# Patient Record
Sex: Female | Born: 1955 | Race: White | Hispanic: No | Marital: Married | Smoking: Never smoker
Health system: Southern US, Community
[De-identification: ages and names within clinical notes are randomized; demographics above are authoritative.]

## PROBLEM LIST (undated history)

## (undated) DIAGNOSIS — E876 Hypokalemia: Secondary | ICD-10-CM

## (undated) DIAGNOSIS — G4700 Insomnia, unspecified: Secondary | ICD-10-CM

## (undated) DIAGNOSIS — I10 Essential (primary) hypertension: Secondary | ICD-10-CM

## (undated) DIAGNOSIS — E559 Vitamin D deficiency, unspecified: Secondary | ICD-10-CM

## (undated) DIAGNOSIS — B009 Herpesviral infection, unspecified: Secondary | ICD-10-CM

## (undated) HISTORY — DX: Hypokalemia: E87.6

## (undated) HISTORY — DX: Essential (primary) hypertension: I10

## (undated) HISTORY — DX: Vitamin D deficiency, unspecified: E55.9

## (undated) HISTORY — DX: Insomnia, unspecified: G47.00

## (undated) HISTORY — DX: Herpesviral infection, unspecified: B00.9

---

## 1997-11-20 ENCOUNTER — Other Ambulatory Visit: Admission: RE | Admit: 1997-11-20 | Discharge: 1997-11-20 | Payer: Self-pay | Admitting: Obstetrics and Gynecology

## 1997-12-11 ENCOUNTER — Ambulatory Visit (HOSPITAL_COMMUNITY): Admission: RE | Admit: 1997-12-11 | Discharge: 1997-12-11 | Payer: Self-pay | Admitting: Obstetrics and Gynecology

## 1997-12-11 ENCOUNTER — Encounter: Payer: Self-pay | Admitting: Obstetrics and Gynecology

## 1999-04-02 ENCOUNTER — Encounter: Payer: Self-pay | Admitting: Obstetrics and Gynecology

## 1999-04-02 ENCOUNTER — Ambulatory Visit (HOSPITAL_COMMUNITY): Admission: RE | Admit: 1999-04-02 | Discharge: 1999-04-02 | Payer: Self-pay | Admitting: Obstetrics and Gynecology

## 1999-04-07 ENCOUNTER — Other Ambulatory Visit: Admission: RE | Admit: 1999-04-07 | Discharge: 1999-04-07 | Payer: Self-pay | Admitting: Obstetrics & Gynecology

## 2002-04-27 ENCOUNTER — Other Ambulatory Visit: Admission: RE | Admit: 2002-04-27 | Discharge: 2002-04-27 | Payer: Self-pay | Admitting: Obstetrics and Gynecology

## 2002-05-02 ENCOUNTER — Ambulatory Visit (HOSPITAL_COMMUNITY): Admission: RE | Admit: 2002-05-02 | Discharge: 2002-05-02 | Payer: Self-pay | Admitting: Obstetrics and Gynecology

## 2002-05-02 ENCOUNTER — Encounter: Payer: Self-pay | Admitting: Obstetrics and Gynecology

## 2003-05-14 ENCOUNTER — Inpatient Hospital Stay (HOSPITAL_COMMUNITY): Admission: EM | Admit: 2003-05-14 | Discharge: 2003-05-15 | Payer: Self-pay | Admitting: Emergency Medicine

## 2003-11-06 ENCOUNTER — Other Ambulatory Visit: Admission: RE | Admit: 2003-11-06 | Discharge: 2003-11-06 | Payer: Self-pay | Admitting: Obstetrics and Gynecology

## 2003-12-27 ENCOUNTER — Encounter: Admission: RE | Admit: 2003-12-27 | Discharge: 2003-12-27 | Payer: Self-pay | Admitting: Obstetrics and Gynecology

## 2005-08-26 ENCOUNTER — Other Ambulatory Visit: Admission: RE | Admit: 2005-08-26 | Discharge: 2005-08-26 | Payer: Self-pay | Admitting: Family Medicine

## 2005-09-06 ENCOUNTER — Encounter: Admission: RE | Admit: 2005-09-06 | Discharge: 2005-09-06 | Payer: Self-pay | Admitting: Family Medicine

## 2005-12-21 ENCOUNTER — Ambulatory Visit (HOSPITAL_COMMUNITY): Admission: RE | Admit: 2005-12-21 | Discharge: 2005-12-21 | Payer: Self-pay | Admitting: Obstetrics and Gynecology

## 2006-12-16 ENCOUNTER — Other Ambulatory Visit: Admission: RE | Admit: 2006-12-16 | Discharge: 2006-12-16 | Payer: Self-pay | Admitting: Obstetrics and Gynecology

## 2007-03-28 ENCOUNTER — Ambulatory Visit (HOSPITAL_COMMUNITY): Admission: RE | Admit: 2007-03-28 | Discharge: 2007-03-28 | Payer: Self-pay | Admitting: Obstetrics and Gynecology

## 2007-12-26 ENCOUNTER — Other Ambulatory Visit: Admission: RE | Admit: 2007-12-26 | Discharge: 2007-12-26 | Payer: Self-pay | Admitting: Obstetrics and Gynecology

## 2008-04-03 ENCOUNTER — Ambulatory Visit (HOSPITAL_COMMUNITY): Admission: RE | Admit: 2008-04-03 | Discharge: 2008-04-03 | Payer: Self-pay | Admitting: Obstetrics and Gynecology

## 2009-11-19 ENCOUNTER — Ambulatory Visit (HOSPITAL_COMMUNITY): Admission: RE | Admit: 2009-11-19 | Discharge: 2009-11-19 | Payer: Self-pay | Admitting: Obstetrics and Gynecology

## 2011-11-03 ENCOUNTER — Other Ambulatory Visit: Payer: Self-pay | Admitting: Obstetrics and Gynecology

## 2011-11-03 DIAGNOSIS — Z1231 Encounter for screening mammogram for malignant neoplasm of breast: Secondary | ICD-10-CM

## 2011-11-23 ENCOUNTER — Ambulatory Visit (HOSPITAL_COMMUNITY)
Admission: RE | Admit: 2011-11-23 | Discharge: 2011-11-23 | Disposition: A | Discharge status: Home / Self Care | Payer: Managed Care, Other (non HMO) | Source: Ambulatory Visit | Attending: Obstetrics and Gynecology | Admitting: Obstetrics and Gynecology

## 2011-11-23 DIAGNOSIS — Z1231 Encounter for screening mammogram for malignant neoplasm of breast: Secondary | ICD-10-CM | POA: Insufficient documentation

## 2012-01-03 ENCOUNTER — Other Ambulatory Visit (HOSPITAL_COMMUNITY): Payer: Self-pay | Admitting: Family Medicine

## 2012-01-03 DIAGNOSIS — E0590 Thyrotoxicosis, unspecified without thyrotoxic crisis or storm: Secondary | ICD-10-CM

## 2012-01-26 ENCOUNTER — Ambulatory Visit (HOSPITAL_COMMUNITY): Payer: Managed Care, Other (non HMO)

## 2012-01-27 ENCOUNTER — Encounter (HOSPITAL_COMMUNITY): Payer: Managed Care, Other (non HMO)

## 2012-02-15 ENCOUNTER — Encounter (HOSPITAL_COMMUNITY)
Admission: RE | Admit: 2012-02-15 | Discharge: 2012-02-15 | Disposition: A | Discharge status: Home / Self Care | Payer: BC Managed Care – PPO | Source: Ambulatory Visit | Attending: Family Medicine | Admitting: Family Medicine

## 2012-02-15 DIAGNOSIS — E0590 Thyrotoxicosis, unspecified without thyrotoxic crisis or storm: Secondary | ICD-10-CM | POA: Insufficient documentation

## 2012-02-16 ENCOUNTER — Encounter (HOSPITAL_COMMUNITY)
Admission: RE | Admit: 2012-02-16 | Discharge: 2012-02-16 | Disposition: A | Discharge status: Home / Self Care | Payer: BC Managed Care – PPO | Source: Ambulatory Visit | Attending: Family Medicine | Admitting: Family Medicine

## 2012-02-16 MED ORDER — SODIUM PERTECHNETATE TC 99M INJECTION
10.5000 | Freq: Once | INTRAVENOUS | Status: AC | PRN
  Administered 2012-02-16: 11 via INTRAVENOUS

## 2012-02-16 MED ORDER — SODIUM IODIDE I 131 CAPSULE
4.0000 | Freq: Once | INTRAVENOUS | Status: AC | PRN
  Administered 2012-02-16: 4 via ORAL

## 2012-11-28 ENCOUNTER — Ambulatory Visit: Payer: Self-pay | Admitting: Obstetrics and Gynecology

## 2012-11-28 ENCOUNTER — Ambulatory Visit: Payer: Self-pay | Admitting: Gynecology

## 2013-11-26 ENCOUNTER — Other Ambulatory Visit: Payer: Self-pay | Admitting: Family Medicine

## 2013-11-26 ENCOUNTER — Ambulatory Visit
Admission: RE | Admit: 2013-11-26 | Discharge: 2013-11-26 | Disposition: A | Discharge status: Home / Self Care | Payer: BC Managed Care – PPO | Source: Ambulatory Visit | Attending: Family Medicine | Admitting: Family Medicine

## 2013-11-26 DIAGNOSIS — R1032 Left lower quadrant pain: Secondary | ICD-10-CM

## 2014-05-16 ENCOUNTER — Other Ambulatory Visit: Payer: Self-pay | Admitting: Family Medicine

## 2014-05-16 ENCOUNTER — Other Ambulatory Visit (HOSPITAL_COMMUNITY)
Admission: RE | Admit: 2014-05-16 | Discharge: 2014-05-16 | Disposition: A | Discharge status: Home / Self Care | Payer: BC Managed Care – PPO | Source: Ambulatory Visit | Attending: Family Medicine | Admitting: Family Medicine

## 2014-05-16 DIAGNOSIS — Z01419 Encounter for gynecological examination (general) (routine) without abnormal findings: Secondary | ICD-10-CM | POA: Insufficient documentation

## 2014-05-20 LAB — CYTOLOGY - PAP

## 2014-05-21 ENCOUNTER — Other Ambulatory Visit (HOSPITAL_COMMUNITY): Payer: Self-pay | Admitting: Family Medicine

## 2014-05-21 DIAGNOSIS — Z1231 Encounter for screening mammogram for malignant neoplasm of breast: Secondary | ICD-10-CM

## 2014-06-05 ENCOUNTER — Ambulatory Visit (HOSPITAL_COMMUNITY): Payer: BC Managed Care – PPO

## 2014-06-14 ENCOUNTER — Ambulatory Visit (HOSPITAL_COMMUNITY): Payer: BC Managed Care – PPO

## 2014-06-18 ENCOUNTER — Ambulatory Visit (HOSPITAL_COMMUNITY)
Admission: RE | Admit: 2014-06-18 | Discharge: 2014-06-18 | Disposition: A | Discharge status: Home / Self Care | Payer: BC Managed Care – PPO | Source: Ambulatory Visit | Attending: Family Medicine | Admitting: Family Medicine

## 2014-06-18 DIAGNOSIS — Z1231 Encounter for screening mammogram for malignant neoplasm of breast: Secondary | ICD-10-CM | POA: Diagnosis not present

## 2017-01-12 ENCOUNTER — Other Ambulatory Visit: Payer: Self-pay | Admitting: Family Medicine

## 2017-01-12 DIAGNOSIS — Z1231 Encounter for screening mammogram for malignant neoplasm of breast: Secondary | ICD-10-CM

## 2017-02-14 ENCOUNTER — Ambulatory Visit
Admission: RE | Admit: 2017-02-14 | Discharge: 2017-02-14 | Disposition: A | Discharge status: Home / Self Care | Payer: BC Managed Care – PPO | Source: Ambulatory Visit | Attending: Family Medicine | Admitting: Family Medicine

## 2017-02-14 DIAGNOSIS — Z1231 Encounter for screening mammogram for malignant neoplasm of breast: Secondary | ICD-10-CM

## 2018-09-26 ENCOUNTER — Other Ambulatory Visit: Payer: Self-pay | Admitting: Gastroenterology

## 2018-09-26 DIAGNOSIS — R1310 Dysphagia, unspecified: Secondary | ICD-10-CM

## 2018-10-02 ENCOUNTER — Ambulatory Visit
Admission: RE | Admit: 2018-10-02 | Discharge: 2018-10-02 | Disposition: A | Discharge status: Home / Self Care | Payer: BC Managed Care – PPO | Source: Ambulatory Visit | Attending: Gastroenterology | Admitting: Gastroenterology

## 2018-10-02 DIAGNOSIS — R1310 Dysphagia, unspecified: Secondary | ICD-10-CM

## 2018-10-12 ENCOUNTER — Other Ambulatory Visit: Payer: Self-pay | Admitting: Family Medicine

## 2018-10-12 ENCOUNTER — Other Ambulatory Visit (HOSPITAL_COMMUNITY)
Admission: RE | Admit: 2018-10-12 | Discharge: 2018-10-12 | Disposition: A | Discharge status: Home / Self Care | Payer: BC Managed Care – PPO | Source: Ambulatory Visit | Attending: Family Medicine | Admitting: Family Medicine

## 2018-10-12 DIAGNOSIS — Z01419 Encounter for gynecological examination (general) (routine) without abnormal findings: Secondary | ICD-10-CM | POA: Insufficient documentation

## 2018-10-18 LAB — CYTOLOGY - PAP
Adequacy: ABSENT
Diagnosis: NEGATIVE

## 2020-02-27 ENCOUNTER — Other Ambulatory Visit: Payer: Self-pay | Admitting: Family Medicine

## 2020-02-27 DIAGNOSIS — Z1231 Encounter for screening mammogram for malignant neoplasm of breast: Secondary | ICD-10-CM

## 2020-03-10 ENCOUNTER — Ambulatory Visit
Admission: RE | Admit: 2020-03-10 | Discharge: 2020-03-10 | Disposition: A | Discharge status: Home / Self Care | Payer: BC Managed Care – PPO | Source: Ambulatory Visit | Attending: Family Medicine | Admitting: Family Medicine

## 2020-03-10 ENCOUNTER — Other Ambulatory Visit: Payer: Self-pay

## 2020-03-10 DIAGNOSIS — Z1231 Encounter for screening mammogram for malignant neoplasm of breast: Secondary | ICD-10-CM

## 2020-04-16 ENCOUNTER — Other Ambulatory Visit: Payer: Self-pay

## 2020-04-16 DIAGNOSIS — R519 Headache, unspecified: Secondary | ICD-10-CM

## 2020-05-13 ENCOUNTER — Other Ambulatory Visit: Payer: BC Managed Care – PPO

## 2020-05-27 ENCOUNTER — Other Ambulatory Visit: Payer: BC Managed Care – PPO

## 2020-12-29 ENCOUNTER — Other Ambulatory Visit: Payer: Self-pay

## 2020-12-29 DIAGNOSIS — R519 Headache, unspecified: Secondary | ICD-10-CM

## 2020-12-29 DIAGNOSIS — R200 Anesthesia of skin: Secondary | ICD-10-CM

## 2021-01-29 ENCOUNTER — Other Ambulatory Visit: Payer: BC Managed Care – PPO

## 2021-02-14 ENCOUNTER — Ambulatory Visit
Admission: RE | Admit: 2021-02-14 | Discharge: 2021-02-14 | Disposition: A | Discharge status: Home / Self Care | Payer: BC Managed Care – PPO | Source: Ambulatory Visit | Attending: Family Medicine | Admitting: Family Medicine

## 2021-02-14 ENCOUNTER — Other Ambulatory Visit: Payer: Self-pay

## 2021-02-14 DIAGNOSIS — R200 Anesthesia of skin: Secondary | ICD-10-CM

## 2021-02-14 MED ORDER — GADOBENATE DIMEGLUMINE 529 MG/ML IV SOLN
20.0000 mL | Freq: Once | INTRAVENOUS | Status: AC | PRN
  Administered 2021-02-14: 20 mL via INTRAVENOUS

## 2021-06-24 DIAGNOSIS — Z23 Encounter for immunization: Secondary | ICD-10-CM | POA: Diagnosis not present

## 2021-06-24 DIAGNOSIS — I10 Essential (primary) hypertension: Secondary | ICD-10-CM | POA: Diagnosis not present

## 2021-06-24 DIAGNOSIS — E782 Mixed hyperlipidemia: Secondary | ICD-10-CM | POA: Diagnosis not present

## 2021-06-24 DIAGNOSIS — E0500 Thyrotoxicosis with diffuse goiter without thyrotoxic crisis or storm: Secondary | ICD-10-CM | POA: Diagnosis not present

## 2021-06-24 DIAGNOSIS — M79644 Pain in right finger(s): Secondary | ICD-10-CM | POA: Diagnosis not present

## 2021-06-24 DIAGNOSIS — G4700 Insomnia, unspecified: Secondary | ICD-10-CM | POA: Diagnosis not present

## 2021-06-24 DIAGNOSIS — R1310 Dysphagia, unspecified: Secondary | ICD-10-CM | POA: Diagnosis not present

## 2021-06-24 DIAGNOSIS — B001 Herpesviral vesicular dermatitis: Secondary | ICD-10-CM | POA: Diagnosis not present

## 2021-06-24 DIAGNOSIS — E669 Obesity, unspecified: Secondary | ICD-10-CM | POA: Diagnosis not present

## 2021-09-09 DIAGNOSIS — J3489 Other specified disorders of nose and nasal sinuses: Secondary | ICD-10-CM | POA: Diagnosis not present

## 2021-09-09 DIAGNOSIS — M25511 Pain in right shoulder: Secondary | ICD-10-CM | POA: Diagnosis not present

## 2021-09-09 DIAGNOSIS — R059 Cough, unspecified: Secondary | ICD-10-CM | POA: Diagnosis not present

## 2022-01-06 ENCOUNTER — Other Ambulatory Visit: Payer: Self-pay | Admitting: Family Medicine

## 2022-01-06 DIAGNOSIS — Z13820 Encounter for screening for osteoporosis: Secondary | ICD-10-CM

## 2022-01-06 DIAGNOSIS — Z1231 Encounter for screening mammogram for malignant neoplasm of breast: Secondary | ICD-10-CM

## 2022-01-06 DIAGNOSIS — Z0000 Encounter for general adult medical examination without abnormal findings: Secondary | ICD-10-CM | POA: Diagnosis not present

## 2022-01-06 DIAGNOSIS — E0500 Thyrotoxicosis with diffuse goiter without thyrotoxic crisis or storm: Secondary | ICD-10-CM | POA: Diagnosis not present

## 2022-01-06 DIAGNOSIS — G4700 Insomnia, unspecified: Secondary | ICD-10-CM | POA: Diagnosis not present

## 2022-01-06 DIAGNOSIS — Z23 Encounter for immunization: Secondary | ICD-10-CM | POA: Diagnosis not present

## 2022-01-06 DIAGNOSIS — K219 Gastro-esophageal reflux disease without esophagitis: Secondary | ICD-10-CM | POA: Diagnosis not present

## 2022-01-06 DIAGNOSIS — I10 Essential (primary) hypertension: Secondary | ICD-10-CM | POA: Diagnosis not present

## 2022-01-06 DIAGNOSIS — E782 Mixed hyperlipidemia: Secondary | ICD-10-CM | POA: Diagnosis not present

## 2022-01-06 DIAGNOSIS — K429 Umbilical hernia without obstruction or gangrene: Secondary | ICD-10-CM | POA: Diagnosis not present

## 2022-01-11 DIAGNOSIS — M25511 Pain in right shoulder: Secondary | ICD-10-CM | POA: Diagnosis not present

## 2022-01-19 DIAGNOSIS — M25511 Pain in right shoulder: Secondary | ICD-10-CM | POA: Diagnosis not present

## 2022-02-17 DIAGNOSIS — R945 Abnormal results of liver function studies: Secondary | ICD-10-CM | POA: Diagnosis not present

## 2022-02-19 ENCOUNTER — Other Ambulatory Visit: Payer: Self-pay | Admitting: Family Medicine

## 2022-02-19 DIAGNOSIS — R7989 Other specified abnormal findings of blood chemistry: Secondary | ICD-10-CM | POA: Diagnosis not present

## 2022-02-19 DIAGNOSIS — R945 Abnormal results of liver function studies: Secondary | ICD-10-CM | POA: Diagnosis not present

## 2022-03-04 ENCOUNTER — Ambulatory Visit
Admission: RE | Admit: 2022-03-04 | Discharge: 2022-03-04 | Disposition: A | Discharge status: Home / Self Care | Payer: Medicare PPO | Source: Ambulatory Visit | Attending: Family Medicine | Admitting: Family Medicine

## 2022-03-04 DIAGNOSIS — Z1231 Encounter for screening mammogram for malignant neoplasm of breast: Secondary | ICD-10-CM

## 2022-03-12 ENCOUNTER — Ambulatory Visit
Admission: RE | Admit: 2022-03-12 | Discharge: 2022-03-12 | Disposition: A | Discharge status: Home / Self Care | Payer: Medicare PPO | Source: Ambulatory Visit | Attending: Family Medicine | Admitting: Family Medicine

## 2022-03-12 DIAGNOSIS — R945 Abnormal results of liver function studies: Secondary | ICD-10-CM | POA: Diagnosis not present

## 2022-03-12 DIAGNOSIS — R7989 Other specified abnormal findings of blood chemistry: Secondary | ICD-10-CM

## 2022-03-17 DIAGNOSIS — R350 Frequency of micturition: Secondary | ICD-10-CM | POA: Diagnosis not present

## 2022-03-24 DIAGNOSIS — R7989 Other specified abnormal findings of blood chemistry: Secondary | ICD-10-CM | POA: Diagnosis not present

## 2022-03-24 DIAGNOSIS — D259 Leiomyoma of uterus, unspecified: Secondary | ICD-10-CM | POA: Diagnosis not present

## 2022-03-24 DIAGNOSIS — K760 Fatty (change of) liver, not elsewhere classified: Secondary | ICD-10-CM | POA: Diagnosis not present

## 2022-03-24 DIAGNOSIS — R945 Abnormal results of liver function studies: Secondary | ICD-10-CM | POA: Diagnosis not present

## 2022-03-24 DIAGNOSIS — N8110 Cystocele, unspecified: Secondary | ICD-10-CM | POA: Diagnosis not present

## 2022-03-24 DIAGNOSIS — R198 Other specified symptoms and signs involving the digestive system and abdomen: Secondary | ICD-10-CM | POA: Diagnosis not present

## 2022-06-23 ENCOUNTER — Ambulatory Visit
Admission: RE | Admit: 2022-06-23 | Discharge: 2022-06-23 | Disposition: A | Discharge status: Home / Self Care | Payer: Medicare PPO | Source: Ambulatory Visit | Attending: Family Medicine | Admitting: Family Medicine

## 2022-06-23 DIAGNOSIS — Z13820 Encounter for screening for osteoporosis: Secondary | ICD-10-CM

## 2022-06-23 DIAGNOSIS — E039 Hypothyroidism, unspecified: Secondary | ICD-10-CM | POA: Diagnosis not present

## 2022-06-23 DIAGNOSIS — E349 Endocrine disorder, unspecified: Secondary | ICD-10-CM | POA: Diagnosis not present

## 2022-06-23 DIAGNOSIS — M810 Age-related osteoporosis without current pathological fracture: Secondary | ICD-10-CM | POA: Diagnosis not present

## 2022-07-01 DIAGNOSIS — N398 Other specified disorders of urinary system: Secondary | ICD-10-CM | POA: Diagnosis not present

## 2022-07-01 DIAGNOSIS — N812 Incomplete uterovaginal prolapse: Secondary | ICD-10-CM | POA: Diagnosis not present

## 2022-07-01 DIAGNOSIS — R152 Fecal urgency: Secondary | ICD-10-CM | POA: Diagnosis not present

## 2022-07-01 DIAGNOSIS — R140 Abdominal distension (gaseous): Secondary | ICD-10-CM | POA: Diagnosis not present

## 2022-07-14 DIAGNOSIS — K760 Fatty (change of) liver, not elsewhere classified: Secondary | ICD-10-CM | POA: Diagnosis not present

## 2022-07-14 DIAGNOSIS — M8588 Other specified disorders of bone density and structure, other site: Secondary | ICD-10-CM | POA: Diagnosis not present

## 2022-07-14 DIAGNOSIS — G43909 Migraine, unspecified, not intractable, without status migrainosus: Secondary | ICD-10-CM | POA: Diagnosis not present

## 2022-07-14 DIAGNOSIS — R945 Abnormal results of liver function studies: Secondary | ICD-10-CM | POA: Diagnosis not present

## 2022-07-14 DIAGNOSIS — I10 Essential (primary) hypertension: Secondary | ICD-10-CM | POA: Diagnosis not present

## 2022-07-14 DIAGNOSIS — N8110 Cystocele, unspecified: Secondary | ICD-10-CM | POA: Diagnosis not present

## 2022-07-15 ENCOUNTER — Encounter: Payer: Self-pay | Admitting: Neurology

## 2022-08-05 NOTE — Progress Notes (Signed)
NEUROLOGY CONSULTATION NOTE  ALOUISE LEVENE MRN: [id number] DOB: 11-11-1955  Referring provider: Irven Coe, MD Primary care provider: L. Lupe Carney, MD  Reason for consult:  migraine  Assessment/Plan:   Migraine with aura, without status migrainosus, not intractable  Preventative not indicted When she gets a spell, she will try taking gabapentin 100mg  as needed. Follow up 6 months.   Subjective:  Lynn Hudson is a 67 year old female with HTN, HLD, GERD and fatty liver who presents for migraines.  History supplemented by referring provider's note.  She started having "episodes" in her 75s.  She develops sudden onset severe squeezing pain in her upper mid back that radiates down either arm and leg (usually left) and up the left side of her neck and into her left eye.  There is associated numbness, tingling and sensation that her limbs are like a balloon.  She states it feels like she is "having a heart attack".  She also has associated severe left sided pounding headache.  She also has nausea, photophobia and phonophobia.  Usually lasts 3-5 days, the most severe during the first day but then manageable.  Occurs infrequently, anywhere from once a month to every few months.  She has had 2 in the past 6 months.  During her last two episodes, she had new visual aura described as kaleidoscopic vision in both eyes lasting a couple of minutes, which was new.  She usually treats with Tylenol or ASA.     Workup included MRI of brain with and without contrast on 02/14/2021 which was personally reviewed and revealed mild chronic small vessel ischemic changes but no acute findings.    Her son has migraines   Current NSAIDS/analgesics:  Advil Current triptans:  none Current ergotamine:  none Current anti-emetic:  none Current muscle relaxants:  none Current Antihypertensive medications:  metoprolol succinate 25mg  daily, HCTZ Current Antidepressant medications:  none Current  Anticonvulsant medications:  none Current anti-CGRP:  none Current Antihistamines/Decongestants:  none Other therapy:  heating pad to leg (when affected) Other medications:  Ambien       PAST MEDICAL HISTORY: Past Medical History:  Diagnosis Date   HSV-1 (herpes simplex virus 1) infection    Hypertension    Insomnia    Potassium deficiency    Vitamin D deficiency     MEDICATIONS: Current Outpatient Medications on File Prior to Visit  Medication Sig Dispense Refill   esomeprazole (NEXIUM) 40 MG capsule Take 40 mg by mouth daily.     hydrochlorothiazide (HYDRODIURIL) 25 MG tablet Take 25 mg by mouth daily.     metoprolol succinate (TOPROL-XL) 25 MG 24 hr tablet Take 25 mg by mouth daily.     potassium chloride (KLOR-CON M) 10 MEQ tablet Take 10 mEq by mouth daily.     valACYclovir (VALTREX) 1000 MG tablet Take 1,000 mg by mouth as needed.     Vitamin D, Ergocalciferol, (DRISDOL) 1.25 MG (50000 UNIT) CAPS capsule Take 50,000 Units by mouth once a week.     zolpidem (AMBIEN) 10 MG tablet Take 10 mg by mouth at bedtime.     No current facility-administered medications on file prior to visit.     ALLERGIES: Not on File  FAMILY HISTORY: No family history on file.  Objective:  Blood pressure 102/60, height 5\' 6"  (1.676 m), weight 230 lb (104.3 kg). General: No acute distress.  Patient appears well-groomed.   Head:  Normocephalic/atraumatic Eyes:  fundi examined but not visualized Neck: supple,  no paraspinal tenderness, full range of motion Back: No paraspinal tenderness Heart: regular rate and rhythm Lungs: Clear to auscultation bilaterally. Vascular: No carotid bruits. Neurological Exam: Mental status: alert and oriented to person, place, and time, speech fluent and not dysarthric, language intact. Cranial nerves: CN I: not tested CN II: pupils equal, round and reactive to light, visual fields intact CN III, IV, VI:  full range of motion, no nystagmus, no ptosis CN  V: facial sensation intact. CN VII: upper and lower face symmetric CN VIII: hearing intact CN IX, X: gag intact, uvula midline CN XI: sternocleidomastoid and trapezius muscles intact CN XII: tongue midline Bulk & Tone: normal, no fasciculations. Motor:  muscle strength 5/5 throughout Sensation: Temperature sensation intact, vibratory sensation reduced in toes. Deep Tendon Reflexes:  1+ throughout,  toes downgoing.   Finger to nose testing:  Without dysmetria.   Heel to shin:  Without dysmetria.   Gait:  Normal station and stride.  Romberg negative.    Thank you for allowing me to take part in the care of this patient.  Shon Millet, DO  CC: Irven Coe, MD

## 2022-08-06 ENCOUNTER — Encounter: Payer: Self-pay | Admitting: Neurology

## 2022-08-06 ENCOUNTER — Ambulatory Visit: Payer: Medicare PPO | Admitting: Neurology

## 2022-08-06 VITALS — BP 102/60 | Ht 66.0 in | Wt 230.0 lb

## 2022-08-06 DIAGNOSIS — G43009 Migraine without aura, not intractable, without status migrainosus: Secondary | ICD-10-CM | POA: Diagnosis not present

## 2022-08-06 MED ORDER — GABAPENTIN 100 MG PO CAPS: 100.0000 mg | ORAL_CAPSULE | Freq: Three times a day (TID) | ORAL | 5 refills | Status: AC | PRN

## 2022-08-06 NOTE — Patient Instructions (Signed)
When you get an episode, take gabapentin 100mg  to treat the pain and headache.  May take up to 3 times in a day as needed.

## 2022-08-17 DIAGNOSIS — I10 Essential (primary) hypertension: Secondary | ICD-10-CM | POA: Diagnosis not present

## 2022-10-06 DIAGNOSIS — S46011A Strain of muscle(s) and tendon(s) of the rotator cuff of right shoulder, initial encounter: Secondary | ICD-10-CM | POA: Diagnosis not present

## 2022-10-27 DIAGNOSIS — E782 Mixed hyperlipidemia: Secondary | ICD-10-CM | POA: Diagnosis not present

## 2022-10-27 DIAGNOSIS — Z6839 Body mass index (BMI) 39.0-39.9, adult: Secondary | ICD-10-CM | POA: Diagnosis not present

## 2022-10-27 DIAGNOSIS — R0683 Snoring: Secondary | ICD-10-CM | POA: Diagnosis not present

## 2022-10-27 DIAGNOSIS — Z1331 Encounter for screening for depression: Secondary | ICD-10-CM | POA: Diagnosis not present

## 2022-10-27 DIAGNOSIS — K760 Fatty (change of) liver, not elsewhere classified: Secondary | ICD-10-CM | POA: Diagnosis not present

## 2022-10-27 DIAGNOSIS — I10 Essential (primary) hypertension: Secondary | ICD-10-CM | POA: Diagnosis not present

## 2022-11-09 DIAGNOSIS — S46011A Strain of muscle(s) and tendon(s) of the rotator cuff of right shoulder, initial encounter: Secondary | ICD-10-CM | POA: Diagnosis not present

## 2022-11-10 ENCOUNTER — Ambulatory Visit: Payer: Medicare PPO | Admitting: Neurology

## 2022-11-10 DIAGNOSIS — I10 Essential (primary) hypertension: Secondary | ICD-10-CM | POA: Diagnosis not present

## 2022-11-10 DIAGNOSIS — K760 Fatty (change of) liver, not elsewhere classified: Secondary | ICD-10-CM | POA: Diagnosis not present

## 2022-11-10 DIAGNOSIS — Z6838 Body mass index (BMI) 38.0-38.9, adult: Secondary | ICD-10-CM | POA: Diagnosis not present

## 2022-11-10 DIAGNOSIS — E782 Mixed hyperlipidemia: Secondary | ICD-10-CM | POA: Diagnosis not present

## 2022-11-17 DIAGNOSIS — M25511 Pain in right shoulder: Secondary | ICD-10-CM | POA: Diagnosis not present

## 2022-11-24 DIAGNOSIS — K760 Fatty (change of) liver, not elsewhere classified: Secondary | ICD-10-CM | POA: Diagnosis not present

## 2022-11-24 DIAGNOSIS — Z6837 Body mass index (BMI) 37.0-37.9, adult: Secondary | ICD-10-CM | POA: Diagnosis not present

## 2022-11-24 DIAGNOSIS — I10 Essential (primary) hypertension: Secondary | ICD-10-CM | POA: Diagnosis not present

## 2022-11-24 DIAGNOSIS — E782 Mixed hyperlipidemia: Secondary | ICD-10-CM | POA: Diagnosis not present

## 2022-12-01 DIAGNOSIS — S46011D Strain of muscle(s) and tendon(s) of the rotator cuff of right shoulder, subsequent encounter: Secondary | ICD-10-CM | POA: Diagnosis not present

## 2022-12-15 DIAGNOSIS — E782 Mixed hyperlipidemia: Secondary | ICD-10-CM | POA: Diagnosis not present

## 2022-12-15 DIAGNOSIS — I10 Essential (primary) hypertension: Secondary | ICD-10-CM | POA: Diagnosis not present

## 2022-12-15 DIAGNOSIS — Z6837 Body mass index (BMI) 37.0-37.9, adult: Secondary | ICD-10-CM | POA: Diagnosis not present

## 2022-12-15 DIAGNOSIS — K760 Fatty (change of) liver, not elsewhere classified: Secondary | ICD-10-CM | POA: Diagnosis not present

## 2023-01-11 DIAGNOSIS — Z6838 Body mass index (BMI) 38.0-38.9, adult: Secondary | ICD-10-CM | POA: Diagnosis not present

## 2023-01-11 DIAGNOSIS — E782 Mixed hyperlipidemia: Secondary | ICD-10-CM | POA: Diagnosis not present

## 2023-01-11 DIAGNOSIS — K760 Fatty (change of) liver, not elsewhere classified: Secondary | ICD-10-CM | POA: Diagnosis not present

## 2023-01-11 DIAGNOSIS — I10 Essential (primary) hypertension: Secondary | ICD-10-CM | POA: Diagnosis not present

## 2023-02-06 DIAGNOSIS — R058 Other specified cough: Secondary | ICD-10-CM | POA: Diagnosis not present

## 2023-02-06 DIAGNOSIS — J40 Bronchitis, not specified as acute or chronic: Secondary | ICD-10-CM | POA: Diagnosis not present

## 2023-02-06 DIAGNOSIS — R0981 Nasal congestion: Secondary | ICD-10-CM | POA: Diagnosis not present

## 2023-02-06 DIAGNOSIS — J329 Chronic sinusitis, unspecified: Secondary | ICD-10-CM | POA: Diagnosis not present

## 2023-02-08 ENCOUNTER — Ambulatory Visit: Payer: Medicare PPO | Admitting: Neurology

## 2023-02-21 DIAGNOSIS — S46011D Strain of muscle(s) and tendon(s) of the rotator cuff of right shoulder, subsequent encounter: Secondary | ICD-10-CM | POA: Diagnosis not present

## 2023-02-22 DIAGNOSIS — K760 Fatty (change of) liver, not elsewhere classified: Secondary | ICD-10-CM | POA: Diagnosis not present

## 2023-02-22 DIAGNOSIS — I10 Essential (primary) hypertension: Secondary | ICD-10-CM | POA: Diagnosis not present

## 2023-02-22 DIAGNOSIS — Z6837 Body mass index (BMI) 37.0-37.9, adult: Secondary | ICD-10-CM | POA: Diagnosis not present

## 2023-02-22 DIAGNOSIS — E782 Mixed hyperlipidemia: Secondary | ICD-10-CM | POA: Diagnosis not present

## 2023-02-23 IMAGING — MR MR HEAD WO/W CM
12 series · 48 of 48 positions shown · IV contrast (multihance)
Comparison: None.

CLINICAL DATA: Headaches and bilateral hand numbness

EXAM:
MRI HEAD WITHOUT AND WITH CONTRAST
TECHNIQUE: Multiplanar, multiecho pulse sequences of the brain and surrounding
structures were obtained without and with intravenous contrast.
CONTRAST:  20mL MULTIHANCE GADOBENATE DIMEGLUMINE 529 MG/ML IV SOLN

[Series 3: T1 · sagittal · 5.0mm · 0.45mm/px · 2 of 27 slices shown]
[im 1/27]
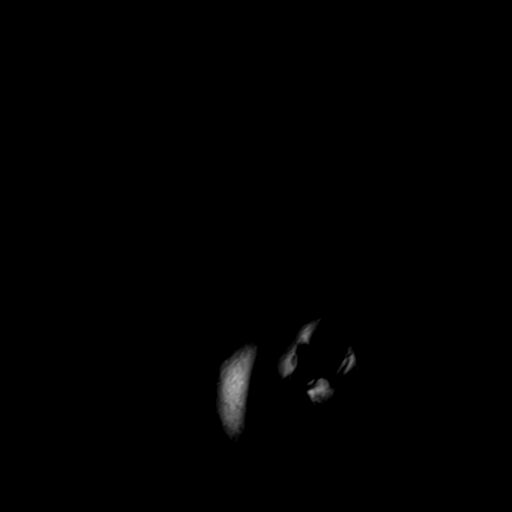
[im 27/27]
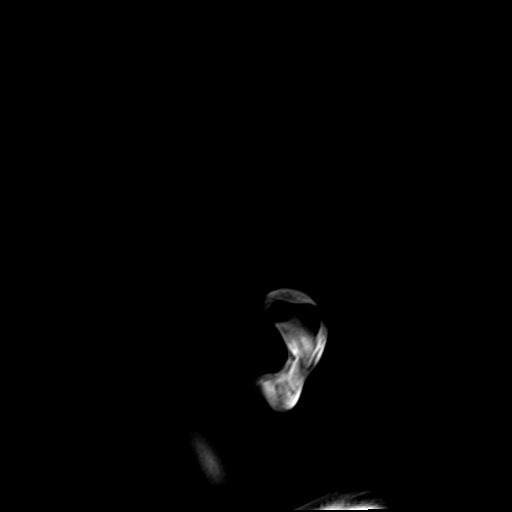

[Series 4: ax ep2d_diff_3 · axial · 3.0mm · 1.80mm/px · z∈[-24,+150]mm · 6 of 112 slices shown]
[im 1/112]
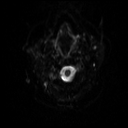
[im 23/112]
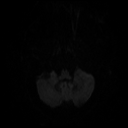
[im 45/112]
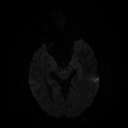
[im 67/112]
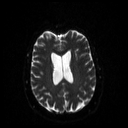
[im 89/112]
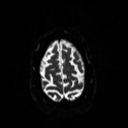
[im 112/112]
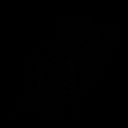

[Series 5: ax ep2d_diff_3_adc · axial · 3.0mm · 1.80mm/px · z∈[-24,+150]mm · 3 of 59 slices shown]
[im 1/59]
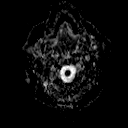
[im 30/59]
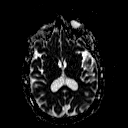
[im 59/59]
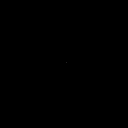

[Series 6: cor ep2d_diff · coronal · 5.0mm · 1.77mm/px · 4 of 66 slices shown]
[im 1/66]
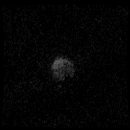
[im 22/66]
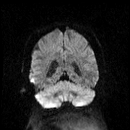
[im 44/66]
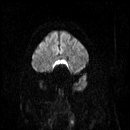
[im 66/66]
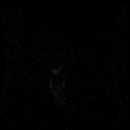

[Series 7: cor ep2d_diff_adc · coronal · 5.0mm · 1.77mm/px · 2 of 33 slices shown]
[im 1/33]
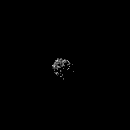
[im 33/33]
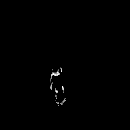

[Series 9: swi_images · axial · 2.0mm · 0.98mm/px · z∈[-28,+146]mm · 5 of 88 slices shown]
[im 1/88]
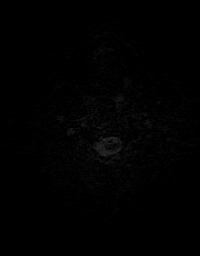
[im 22/88]
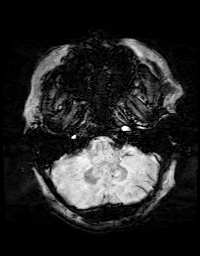
[im 44/88]
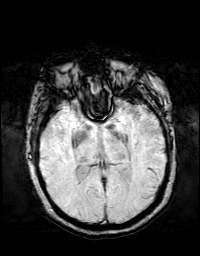
[im 66/88]
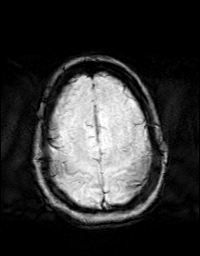
[im 88/88]
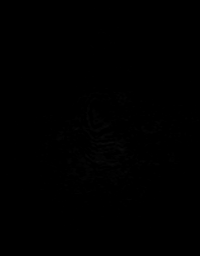

[Series 10: FLAIR · axial · 3.0mm · 0.43mm/px · z∈[-26,+145]mm · 2 of 44 slices shown]
[im 1/44]
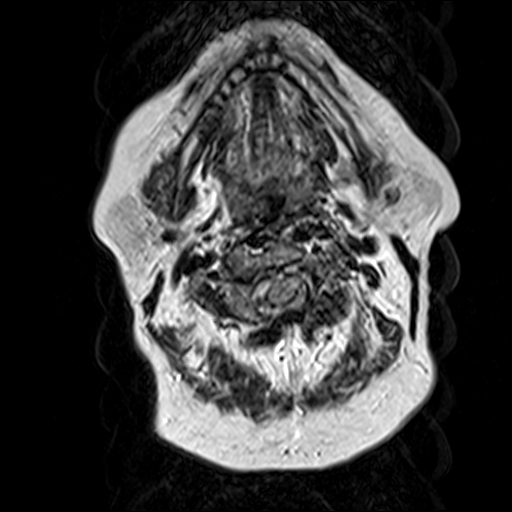
[im 44/44]
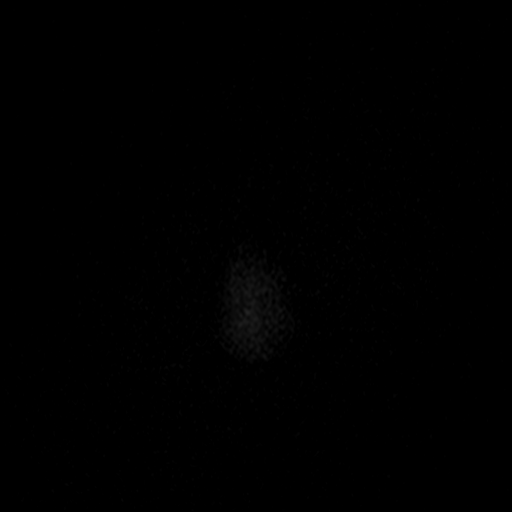

[Series 11: T2 · axial · 5.0mm · 0.65mm/px · z∈[-37,+155]mm · 2 of 33 slices shown (1 of 2)]
[im 1/33]
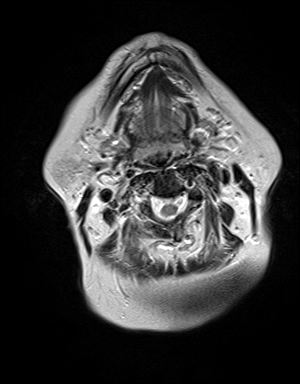
[im 33/33]
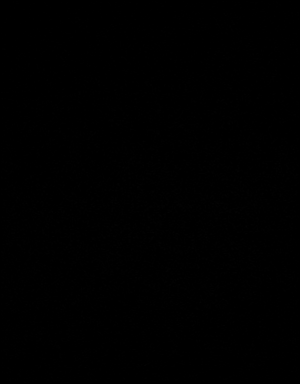

[Series 12: t1_mpr_tra · axial · 1.0mm · 0.72mm/px · z∈[-20,+139]mm · 9 of 160 slices shown]
[im 1/160]
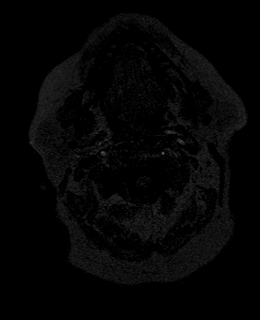
[im 20/160]
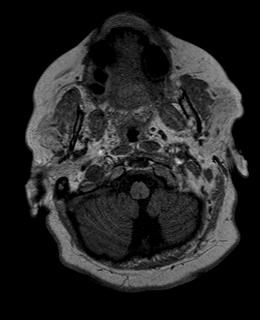
[im 40/160]
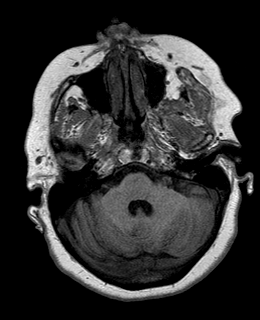
[im 60/160]
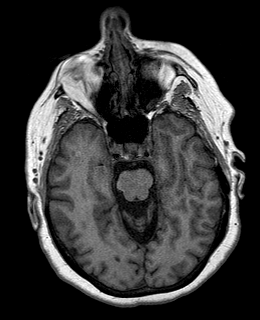
[im 80/160]
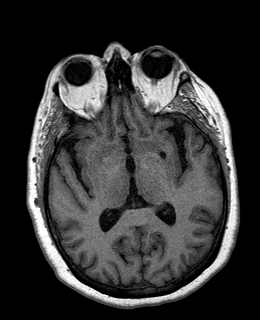
[im 100/160]
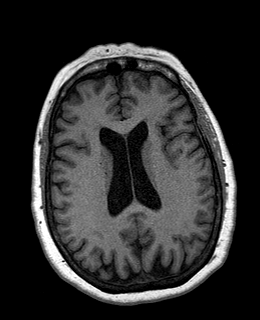
[im 120/160]
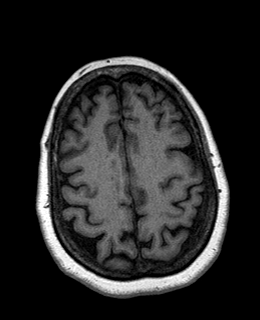
[im 140/160]
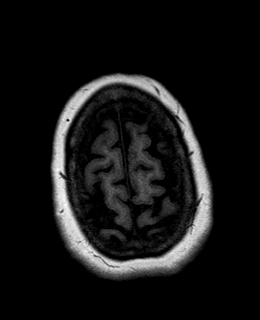
[im 160/160]
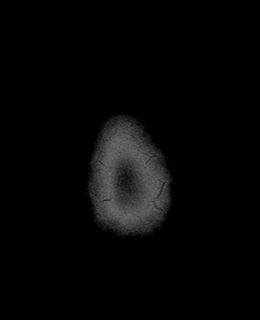

[Series 13: T2 · coronal · 5.0mm · 0.43mm/px · 2 of 33 slices shown (2 of 2)]
[im 1/33]
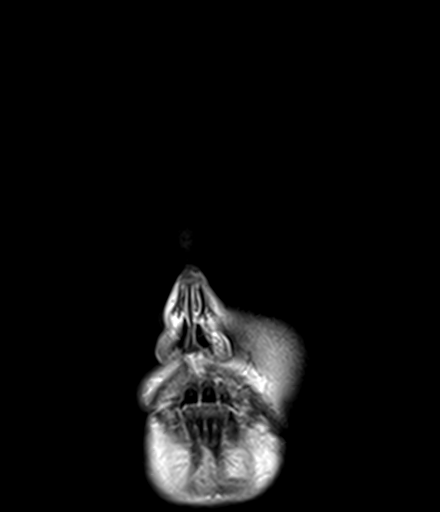
[im 33/33]
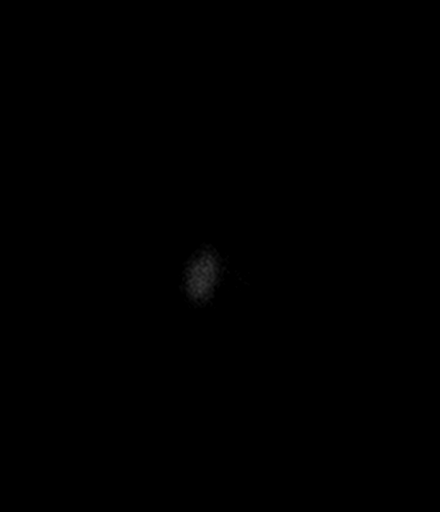

[Series 14: post t1_mpr_tra · axial · 1.0mm · 0.72mm/px · z∈[-20,+139]mm · 9 of 160 slices shown]
[im 1/160]
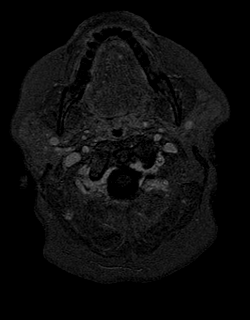
[im 20/160]
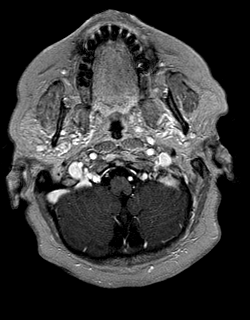
[im 40/160]
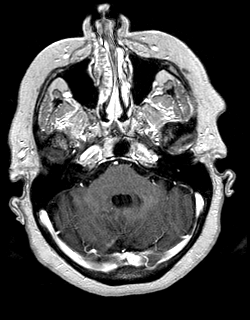
[im 60/160]
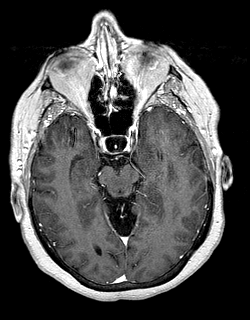
[im 80/160]
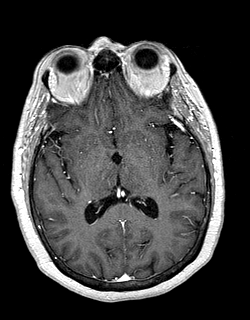
[im 100/160]
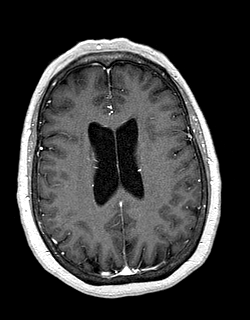
[im 120/160]
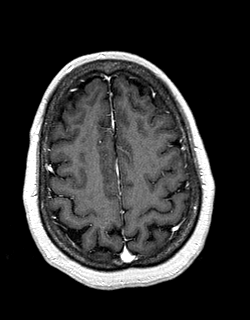
[im 140/160]
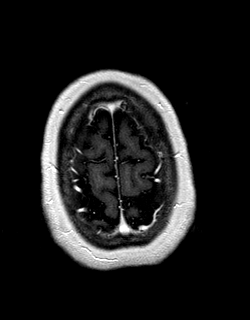
[im 160/160]
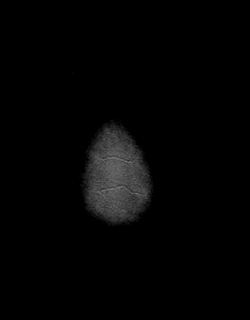

[Series 15: T1 post-contrast · coronal · 5.0mm · 0.72mm/px · 2 of 33 slices shown]
[im 1/33]
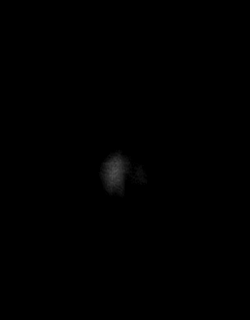
[im 33/33]
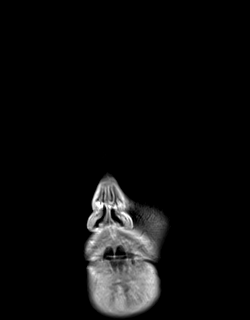

[48 of 48 positions shown; findings below may reference images not displayed]

FINDINGS: Brain: There is no acute infarction or intracranial hemorrhage.
There is no intracranial mass, mass effect, or edema. There is no
hydrocephalus or extra-axial fluid collection. Prominence of the
ventricles and sulci reflects minor parenchymal volume loss.
Scattered few small foci of T2 hyperintensity in the supratentorial
white matter are nonspecific but may reflect minor chronic
microvascular ischemic changes. No abnormal enhancement.

Vascular: Major vessel flow voids at the skull base are preserved.

Skull and upper cervical spine: Normal marrow signal is preserved.

Sinuses/Orbits: Minor mucosal thickening.  Orbits are unremarkable.

Other: Sella is unremarkable.  Mastoid air cells are clear.
IMPRESSION: No evidence of recent infarction, hemorrhage, or mass. No abnormal
enhancement.

Minor chronic microvascular ischemic changes.

## 2023-03-01 DIAGNOSIS — L438 Other lichen planus: Secondary | ICD-10-CM | POA: Diagnosis not present

## 2023-03-01 DIAGNOSIS — B0089 Other herpesviral infection: Secondary | ICD-10-CM | POA: Diagnosis not present

## 2023-03-01 DIAGNOSIS — L821 Other seborrheic keratosis: Secondary | ICD-10-CM | POA: Diagnosis not present

## 2023-03-10 DIAGNOSIS — M8580 Other specified disorders of bone density and structure, unspecified site: Secondary | ICD-10-CM | POA: Diagnosis not present

## 2023-03-10 DIAGNOSIS — I10 Essential (primary) hypertension: Secondary | ICD-10-CM | POA: Diagnosis not present

## 2023-03-10 DIAGNOSIS — E782 Mixed hyperlipidemia: Secondary | ICD-10-CM | POA: Diagnosis not present

## 2023-03-10 DIAGNOSIS — E0500 Thyrotoxicosis with diffuse goiter without thyrotoxic crisis or storm: Secondary | ICD-10-CM | POA: Diagnosis not present

## 2023-03-10 DIAGNOSIS — M8588 Other specified disorders of bone density and structure, other site: Secondary | ICD-10-CM | POA: Diagnosis not present

## 2023-03-15 ENCOUNTER — Other Ambulatory Visit: Payer: Self-pay | Admitting: Family Medicine

## 2023-03-15 DIAGNOSIS — I10 Essential (primary) hypertension: Secondary | ICD-10-CM | POA: Diagnosis not present

## 2023-03-15 DIAGNOSIS — E782 Mixed hyperlipidemia: Secondary | ICD-10-CM | POA: Diagnosis not present

## 2023-03-15 DIAGNOSIS — G4700 Insomnia, unspecified: Secondary | ICD-10-CM | POA: Diagnosis not present

## 2023-03-15 DIAGNOSIS — K760 Fatty (change of) liver, not elsewhere classified: Secondary | ICD-10-CM | POA: Diagnosis not present

## 2023-03-15 DIAGNOSIS — N8110 Cystocele, unspecified: Secondary | ICD-10-CM | POA: Diagnosis not present

## 2023-03-15 DIAGNOSIS — Z0000 Encounter for general adult medical examination without abnormal findings: Secondary | ICD-10-CM

## 2023-03-15 DIAGNOSIS — E0500 Thyrotoxicosis with diffuse goiter without thyrotoxic crisis or storm: Secondary | ICD-10-CM | POA: Diagnosis not present

## 2023-03-15 DIAGNOSIS — K219 Gastro-esophageal reflux disease without esophagitis: Secondary | ICD-10-CM | POA: Diagnosis not present

## 2023-03-21 DIAGNOSIS — J988 Other specified respiratory disorders: Secondary | ICD-10-CM | POA: Diagnosis not present

## 2023-03-23 DIAGNOSIS — K760 Fatty (change of) liver, not elsewhere classified: Secondary | ICD-10-CM | POA: Diagnosis not present

## 2023-03-23 DIAGNOSIS — I10 Essential (primary) hypertension: Secondary | ICD-10-CM | POA: Diagnosis not present

## 2023-03-23 DIAGNOSIS — Z6836 Body mass index (BMI) 36.0-36.9, adult: Secondary | ICD-10-CM | POA: Diagnosis not present

## 2023-03-23 DIAGNOSIS — E782 Mixed hyperlipidemia: Secondary | ICD-10-CM | POA: Diagnosis not present

## 2023-03-29 DIAGNOSIS — X32XXXD Exposure to sunlight, subsequent encounter: Secondary | ICD-10-CM | POA: Diagnosis not present

## 2023-03-29 DIAGNOSIS — L570 Actinic keratosis: Secondary | ICD-10-CM | POA: Diagnosis not present

## 2023-04-04 ENCOUNTER — Ambulatory Visit
Admission: RE | Admit: 2023-04-04 | Discharge: 2023-04-04 | Disposition: A | Discharge status: Home / Self Care | Payer: Medicare PPO | Source: Ambulatory Visit | Attending: Family Medicine | Admitting: Family Medicine

## 2023-04-04 DIAGNOSIS — Z0000 Encounter for general adult medical examination without abnormal findings: Secondary | ICD-10-CM

## 2023-04-04 DIAGNOSIS — Z1231 Encounter for screening mammogram for malignant neoplasm of breast: Secondary | ICD-10-CM | POA: Diagnosis not present

## 2023-04-26 DIAGNOSIS — E782 Mixed hyperlipidemia: Secondary | ICD-10-CM | POA: Diagnosis not present

## 2023-04-26 DIAGNOSIS — H35372 Puckering of macula, left eye: Secondary | ICD-10-CM | POA: Diagnosis not present

## 2023-04-26 DIAGNOSIS — Z6836 Body mass index (BMI) 36.0-36.9, adult: Secondary | ICD-10-CM | POA: Diagnosis not present

## 2023-04-26 DIAGNOSIS — I10 Essential (primary) hypertension: Secondary | ICD-10-CM | POA: Diagnosis not present

## 2023-04-26 DIAGNOSIS — K760 Fatty (change of) liver, not elsewhere classified: Secondary | ICD-10-CM | POA: Diagnosis not present

## 2023-04-27 DIAGNOSIS — L708 Other acne: Secondary | ICD-10-CM | POA: Diagnosis not present

## 2023-04-27 DIAGNOSIS — Z1283 Encounter for screening for malignant neoplasm of skin: Secondary | ICD-10-CM | POA: Diagnosis not present

## 2023-04-27 DIAGNOSIS — D485 Neoplasm of uncertain behavior of skin: Secondary | ICD-10-CM | POA: Diagnosis not present

## 2023-04-27 DIAGNOSIS — D225 Melanocytic nevi of trunk: Secondary | ICD-10-CM | POA: Diagnosis not present

## 2023-04-27 DIAGNOSIS — L568 Other specified acute skin changes due to ultraviolet radiation: Secondary | ICD-10-CM | POA: Diagnosis not present

## 2023-06-07 DIAGNOSIS — I10 Essential (primary) hypertension: Secondary | ICD-10-CM | POA: Diagnosis not present

## 2023-06-07 DIAGNOSIS — K760 Fatty (change of) liver, not elsewhere classified: Secondary | ICD-10-CM | POA: Diagnosis not present

## 2023-06-07 DIAGNOSIS — E782 Mixed hyperlipidemia: Secondary | ICD-10-CM | POA: Diagnosis not present

## 2023-06-07 DIAGNOSIS — Z6836 Body mass index (BMI) 36.0-36.9, adult: Secondary | ICD-10-CM | POA: Diagnosis not present

## 2023-06-30 DIAGNOSIS — I10 Essential (primary) hypertension: Secondary | ICD-10-CM | POA: Diagnosis not present

## 2023-06-30 DIAGNOSIS — R058 Other specified cough: Secondary | ICD-10-CM | POA: Diagnosis not present

## 2023-06-30 DIAGNOSIS — Z6836 Body mass index (BMI) 36.0-36.9, adult: Secondary | ICD-10-CM | POA: Diagnosis not present

## 2023-06-30 DIAGNOSIS — J988 Other specified respiratory disorders: Secondary | ICD-10-CM | POA: Diagnosis not present

## 2023-07-28 DIAGNOSIS — E782 Mixed hyperlipidemia: Secondary | ICD-10-CM | POA: Diagnosis not present

## 2023-07-28 DIAGNOSIS — K760 Fatty (change of) liver, not elsewhere classified: Secondary | ICD-10-CM | POA: Diagnosis not present

## 2023-07-28 DIAGNOSIS — I10 Essential (primary) hypertension: Secondary | ICD-10-CM | POA: Diagnosis not present

## 2023-07-28 DIAGNOSIS — Z6836 Body mass index (BMI) 36.0-36.9, adult: Secondary | ICD-10-CM | POA: Diagnosis not present

## 2023-08-08 DIAGNOSIS — Z6837 Body mass index (BMI) 37.0-37.9, adult: Secondary | ICD-10-CM | POA: Diagnosis not present

## 2023-08-08 DIAGNOSIS — E782 Mixed hyperlipidemia: Secondary | ICD-10-CM | POA: Diagnosis not present

## 2023-08-08 DIAGNOSIS — E0500 Thyrotoxicosis with diffuse goiter without thyrotoxic crisis or storm: Secondary | ICD-10-CM | POA: Diagnosis not present

## 2023-08-08 DIAGNOSIS — I10 Essential (primary) hypertension: Secondary | ICD-10-CM | POA: Diagnosis not present

## 2023-09-08 DIAGNOSIS — E782 Mixed hyperlipidemia: Secondary | ICD-10-CM | POA: Diagnosis not present

## 2023-09-08 DIAGNOSIS — K5903 Drug induced constipation: Secondary | ICD-10-CM | POA: Diagnosis not present

## 2023-09-08 DIAGNOSIS — Z6836 Body mass index (BMI) 36.0-36.9, adult: Secondary | ICD-10-CM | POA: Diagnosis not present

## 2023-09-08 DIAGNOSIS — I10 Essential (primary) hypertension: Secondary | ICD-10-CM | POA: Diagnosis not present

## 2023-09-08 DIAGNOSIS — K760 Fatty (change of) liver, not elsewhere classified: Secondary | ICD-10-CM | POA: Diagnosis not present

## 2023-09-13 DIAGNOSIS — M8580 Other specified disorders of bone density and structure, unspecified site: Secondary | ICD-10-CM | POA: Diagnosis not present

## 2023-09-13 DIAGNOSIS — I10 Essential (primary) hypertension: Secondary | ICD-10-CM | POA: Diagnosis not present

## 2023-09-13 DIAGNOSIS — K760 Fatty (change of) liver, not elsewhere classified: Secondary | ICD-10-CM | POA: Diagnosis not present

## 2023-09-13 DIAGNOSIS — E782 Mixed hyperlipidemia: Secondary | ICD-10-CM | POA: Diagnosis not present

## 2023-09-13 DIAGNOSIS — E0500 Thyrotoxicosis with diffuse goiter without thyrotoxic crisis or storm: Secondary | ICD-10-CM | POA: Diagnosis not present

## 2023-10-19 DIAGNOSIS — M8580 Other specified disorders of bone density and structure, unspecified site: Secondary | ICD-10-CM | POA: Diagnosis not present

## 2023-10-19 DIAGNOSIS — E559 Vitamin D deficiency, unspecified: Secondary | ICD-10-CM | POA: Diagnosis not present

## 2023-10-19 DIAGNOSIS — E782 Mixed hyperlipidemia: Secondary | ICD-10-CM | POA: Diagnosis not present

## 2023-10-19 DIAGNOSIS — B001 Herpesviral vesicular dermatitis: Secondary | ICD-10-CM | POA: Diagnosis not present

## 2023-10-19 DIAGNOSIS — M720 Palmar fascial fibromatosis [Dupuytren]: Secondary | ICD-10-CM | POA: Diagnosis not present

## 2023-10-19 DIAGNOSIS — R1310 Dysphagia, unspecified: Secondary | ICD-10-CM | POA: Diagnosis not present

## 2023-10-19 DIAGNOSIS — I10 Essential (primary) hypertension: Secondary | ICD-10-CM | POA: Diagnosis not present

## 2023-10-19 DIAGNOSIS — K219 Gastro-esophageal reflux disease without esophagitis: Secondary | ICD-10-CM | POA: Diagnosis not present

## 2023-10-20 DIAGNOSIS — K760 Fatty (change of) liver, not elsewhere classified: Secondary | ICD-10-CM | POA: Diagnosis not present

## 2023-10-20 DIAGNOSIS — I10 Essential (primary) hypertension: Secondary | ICD-10-CM | POA: Diagnosis not present

## 2023-10-20 DIAGNOSIS — K5903 Drug induced constipation: Secondary | ICD-10-CM | POA: Diagnosis not present

## 2023-10-20 DIAGNOSIS — E782 Mixed hyperlipidemia: Secondary | ICD-10-CM | POA: Diagnosis not present

## 2023-10-20 DIAGNOSIS — Z6835 Body mass index (BMI) 35.0-35.9, adult: Secondary | ICD-10-CM | POA: Diagnosis not present

## 2023-10-24 DIAGNOSIS — L820 Inflamed seborrheic keratosis: Secondary | ICD-10-CM | POA: Diagnosis not present

## 2023-10-24 DIAGNOSIS — L818 Other specified disorders of pigmentation: Secondary | ICD-10-CM | POA: Diagnosis not present

## 2023-10-24 DIAGNOSIS — L218 Other seborrheic dermatitis: Secondary | ICD-10-CM | POA: Diagnosis not present

## 2023-11-15 DIAGNOSIS — M720 Palmar fascial fibromatosis [Dupuytren]: Secondary | ICD-10-CM | POA: Diagnosis not present

## 2023-12-07 DIAGNOSIS — E782 Mixed hyperlipidemia: Secondary | ICD-10-CM | POA: Diagnosis not present

## 2023-12-07 DIAGNOSIS — K5903 Drug induced constipation: Secondary | ICD-10-CM | POA: Diagnosis not present

## 2023-12-07 DIAGNOSIS — Z6835 Body mass index (BMI) 35.0-35.9, adult: Secondary | ICD-10-CM | POA: Diagnosis not present

## 2023-12-07 DIAGNOSIS — K760 Fatty (change of) liver, not elsewhere classified: Secondary | ICD-10-CM | POA: Diagnosis not present

## 2023-12-07 DIAGNOSIS — I10 Essential (primary) hypertension: Secondary | ICD-10-CM | POA: Diagnosis not present

## 2024-01-09 DIAGNOSIS — E782 Mixed hyperlipidemia: Secondary | ICD-10-CM | POA: Diagnosis not present

## 2024-01-09 DIAGNOSIS — I10 Essential (primary) hypertension: Secondary | ICD-10-CM | POA: Diagnosis not present

## 2024-01-17 DIAGNOSIS — K5903 Drug induced constipation: Secondary | ICD-10-CM | POA: Diagnosis not present

## 2024-01-17 DIAGNOSIS — I10 Essential (primary) hypertension: Secondary | ICD-10-CM | POA: Diagnosis not present

## 2024-01-17 DIAGNOSIS — E782 Mixed hyperlipidemia: Secondary | ICD-10-CM | POA: Diagnosis not present

## 2024-01-17 DIAGNOSIS — K760 Fatty (change of) liver, not elsewhere classified: Secondary | ICD-10-CM | POA: Diagnosis not present

## 2024-01-17 DIAGNOSIS — Z6835 Body mass index (BMI) 35.0-35.9, adult: Secondary | ICD-10-CM | POA: Diagnosis not present
